# Patient Record
Sex: Male | Born: 2005 | Race: Black or African American | Hispanic: No | Marital: Single | Smoking: Never smoker
Health system: Southern US, Community
[De-identification: ages and names within clinical notes are randomized; demographics above are authoritative.]

## PROBLEM LIST (undated history)

## (undated) DIAGNOSIS — J45909 Unspecified asthma, uncomplicated: Secondary | ICD-10-CM

## (undated) DIAGNOSIS — L309 Dermatitis, unspecified: Secondary | ICD-10-CM

---
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
## 2023-06-04 ENCOUNTER — Other Ambulatory Visit: Payer: Self-pay

## 2023-06-04 ENCOUNTER — Encounter (HOSPITAL_BASED_OUTPATIENT_CLINIC_OR_DEPARTMENT_OTHER): Payer: Self-pay | Admitting: Emergency Medicine

## 2023-06-04 ENCOUNTER — Emergency Department (HOSPITAL_BASED_OUTPATIENT_CLINIC_OR_DEPARTMENT_OTHER): Admitting: Radiology

## 2023-06-04 ENCOUNTER — Emergency Department (HOSPITAL_BASED_OUTPATIENT_CLINIC_OR_DEPARTMENT_OTHER)
Admission: EM | Admit: 2023-06-04 | Discharge: 2023-06-04 | Disposition: A | Discharge status: Home / Self Care | Attending: Emergency Medicine | Admitting: Emergency Medicine

## 2023-06-04 DIAGNOSIS — X501XXA Overexertion from prolonged static or awkward postures, initial encounter: Secondary | ICD-10-CM | POA: Diagnosis not present

## 2023-06-04 DIAGNOSIS — S93401A Sprain of unspecified ligament of right ankle, initial encounter: Secondary | ICD-10-CM | POA: Insufficient documentation

## 2023-06-04 DIAGNOSIS — M25571 Pain in right ankle and joints of right foot: Secondary | ICD-10-CM | POA: Diagnosis present

## 2023-06-04 MED ORDER — ACETAMINOPHEN 500 MG PO TABS
1000.0000 mg | ORAL_TABLET | Freq: Once | ORAL | Status: AC
  Administered 2023-06-04: 1000 mg via ORAL
  Filled 2023-06-04: qty 2

## 2023-06-04 NOTE — ED Triage Notes (Signed)
 Rolled ankle last night and then someone fell onto same ankle. Painful to walk on. Minimal swelling.

## 2023-06-04 NOTE — Discharge Instructions (Signed)
 It was our pleasure to provide your ER care today - we hope that you feel better.  You may wear ankle brace for comfort/support for the next few days.   Take acetaminophen  or ibuprofen as need.   Follow up with primary care doctor in two weeks if symptoms fail to improve/resolve.  Return to ER if worse, new symptoms, severe pain, or other concern.

## 2023-06-04 NOTE — ED Provider Notes (Signed)
  EMERGENCY DEPARTMENT AT [facility] Provider Note   CSN: [id number] Arrival date & time: 06/04/23  1831     History  Chief Complaint  Patient presents with   Ankle Pain    Adrian Bates is a 18 y.o. male.  Pt c/o twisting injury to right ankle last night, inversion injury. C/o pain to lateral aspect ankle. Has been ambulatory but with some pain. Skin intact. No foot/toe numbness or weakness. No knee or proximal tib fib area pain or tenderness. Denies other pain or injury.   The history is provided by the patient.  Ankle Pain Associated symptoms: no fever        Home Medications Prior to Admission medications   Medication Sig Start Date End Date Taking? Authorizing Provider  predniSONE  (DELTASONE ) 20 MG tablet 2 tabs po daily x 4 days 11/29/21   Floyd, Dan, DO      Allergies    Shellfish allergy    Review of Systems   Review of Systems  Constitutional:  Negative for fever.  Musculoskeletal:        Right ankle pain  Skin:  Negative for wound.  Neurological:  Negative for weakness and numbness.    Physical Exam Updated Vital Signs BP 107/61 (BP Location: Right Arm)   Pulse 64   Temp 98.6 F (37 C)   Resp 16   Ht 1.651 m (5\' 5" )   Wt 54.4 kg   SpO2 100%   BMI 19.97 kg/m  Physical Exam Vitals and nursing note reviewed.  Constitutional:      Appearance: Normal appearance. He is well-developed.  HENT:     Head: Atraumatic.     Nose: Nose normal.  Neck:     Trachea: No tracheal deviation.  Cardiovascular:     Pulses: Normal pulses.  Pulmonary:     Effort: Pulmonary effort is normal. No accessory muscle usage.  Musculoskeletal:        General: No swelling.     Comments: Mild tenderness lateral malleolus right ankle. Ankle is grossly stable. No significant sts noted. Foot is of normal color and warmth. Dp/pt palp. No 5th metatarsal tenderness. No proximal tib/fib area pain or tenderness.   Skin:    General: Skin is warm and dry.      Findings: No rash.  Neurological:     Mental Status: He is alert.     Comments: Alert, speech clear. Right foot nvi with intact motor/sens fxn.   Psychiatric:        Mood and Affect: Mood normal.     ED Results / Procedures / Treatments   Labs (all labs ordered are listed, but only abnormal results are displayed) Labs Reviewed - No data to display  EKG None  Radiology DG Ankle Complete Right Result Date: 06/04/2023 CLINICAL DATA:  Status post trauma. EXAM: RIGHT ANKLE - COMPLETE 3+ VIEW COMPARISON:  None Available. FINDINGS: There is no evidence of fracture, dislocation, or joint effusion. There is no evidence of arthropathy or other focal bone abnormality. Soft tissues are unremarkable. IMPRESSION: Negative. Electronically Signed   By: Virgle Grime M.D.   On: 06/04/2023 19:18    Procedures Procedures    Medications Ordered in ED Medications  acetaminophen  (TYLENOL ) tablet 1,000 mg (has no administration in time range)    ED Course/ Medical Decision Making/ A&P  Medical Decision Making Problems Addressed: Sprain of right ankle, unspecified ligament, initial encounter: acute illness or injury  Amount and/or Complexity of Data Reviewed Radiology: ordered and independent interpretation performed. Decision-making details documented in ED Course.  Risk OTC drugs.   Imaging ordered. No meds pta.   Reviewed nursing notes and prior charts for additional history.   Acetaminophen  po.   Xrays reviewed/interpreted by me - no fx.   Ankle brace.   Rec pcp f/u.  Return precautions provided.         Final Clinical Impression(s) / ED Diagnoses Final diagnoses:  None    Rx / DC Orders ED Discharge Orders     None         Guadalupe Lee, MD 06/04/23 1934
